# Patient Record
Sex: Female | Born: 1965 | Race: White | Hispanic: No | Marital: Married
Health system: Southern US, Community
[De-identification: ages and names within clinical notes are randomized; demographics above are authoritative.]

---

## 1998-12-21 ENCOUNTER — Emergency Department (HOSPITAL_COMMUNITY): Admission: EM | Admit: 1998-12-21 | Discharge: 1998-12-21 | Payer: Self-pay | Admitting: Emergency Medicine

## 2001-04-20 ENCOUNTER — Other Ambulatory Visit: Admission: RE | Admit: 2001-04-20 | Discharge: 2001-04-20 | Payer: Self-pay | Admitting: *Deleted

## 2001-04-23 ENCOUNTER — Encounter: Payer: Self-pay | Admitting: General Surgery

## 2001-04-23 ENCOUNTER — Ambulatory Visit (HOSPITAL_COMMUNITY): Admission: RE | Admit: 2001-04-23 | Discharge: 2001-04-23 | Payer: Self-pay | Admitting: General Surgery

## 2001-05-03 ENCOUNTER — Encounter: Payer: Self-pay | Admitting: General Surgery

## 2001-05-03 ENCOUNTER — Encounter (INDEPENDENT_AMBULATORY_CARE_PROVIDER_SITE_OTHER): Payer: Self-pay | Admitting: *Deleted

## 2001-05-03 ENCOUNTER — Ambulatory Visit (HOSPITAL_BASED_OUTPATIENT_CLINIC_OR_DEPARTMENT_OTHER): Admission: RE | Admit: 2001-05-03 | Discharge: 2001-05-03 | Payer: Self-pay | Admitting: General Surgery

## 2002-04-28 ENCOUNTER — Other Ambulatory Visit: Admission: RE | Admit: 2002-04-28 | Discharge: 2002-04-28 | Payer: Self-pay | Admitting: *Deleted

## 2004-10-03 ENCOUNTER — Other Ambulatory Visit: Admission: RE | Admit: 2004-10-03 | Discharge: 2004-10-03 | Payer: Self-pay | Admitting: Family Medicine

## 2005-09-30 ENCOUNTER — Ambulatory Visit (HOSPITAL_COMMUNITY): Admission: RE | Admit: 2005-09-30 | Discharge: 2005-09-30 | Payer: Self-pay | Admitting: Obstetrics and Gynecology

## 2005-10-05 ENCOUNTER — Inpatient Hospital Stay (HOSPITAL_COMMUNITY): Admission: AD | Admit: 2005-10-05 | Discharge: 2005-10-09 | Payer: Self-pay | Admitting: Obstetrics and Gynecology

## 2005-10-06 ENCOUNTER — Encounter (INDEPENDENT_AMBULATORY_CARE_PROVIDER_SITE_OTHER): Payer: Self-pay | Admitting: *Deleted

## 2005-11-10 ENCOUNTER — Other Ambulatory Visit: Admission: RE | Admit: 2005-11-10 | Discharge: 2005-11-10 | Payer: Self-pay | Admitting: Obstetrics and Gynecology

## 2006-06-05 IMAGING — US US OB COMP +14 WK
1 series · 13 of 24 positions shown · non-contrast
Comparison: none

CLINICAL DATA: Evaluate fetal weight and position; assigned gestational age is 40 weeks 4 days.

[Series 1: us ob comp +14 wk · 0.41mm/px · 13 of 24 slices shown]
[im 1/24]
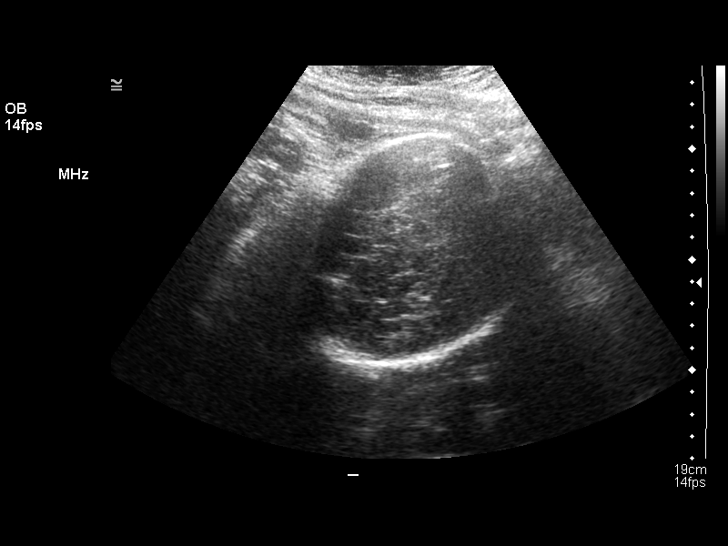
[im 3/24]
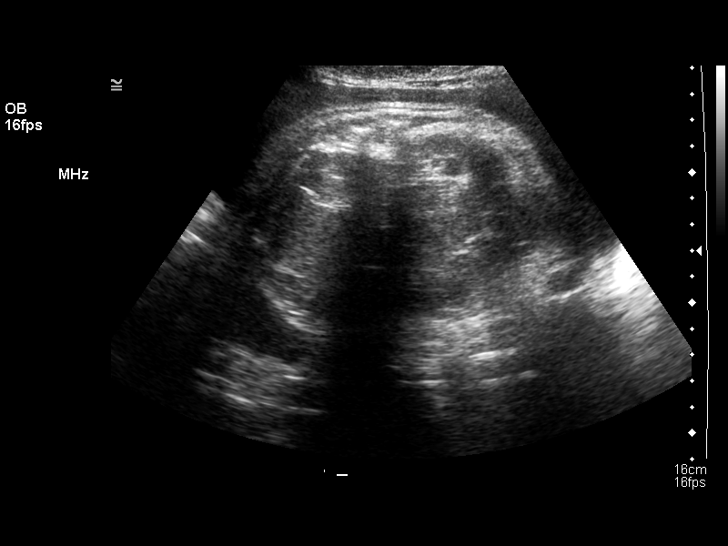
[im 5/24]
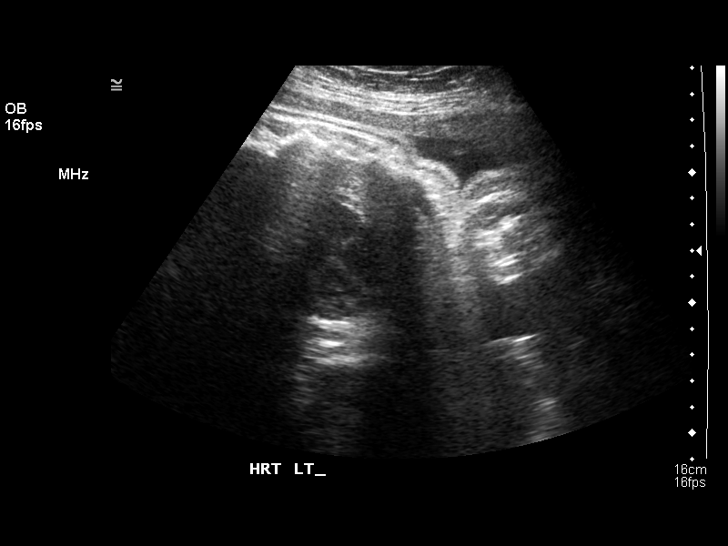
[im 7/24]
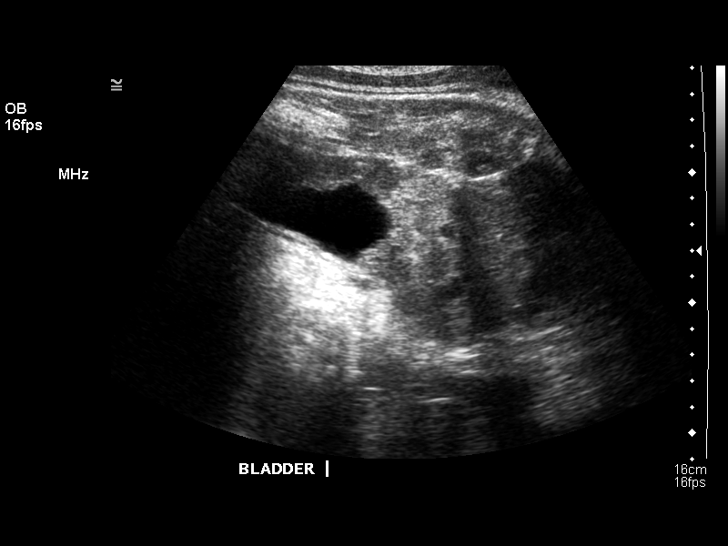
[im 9/24]
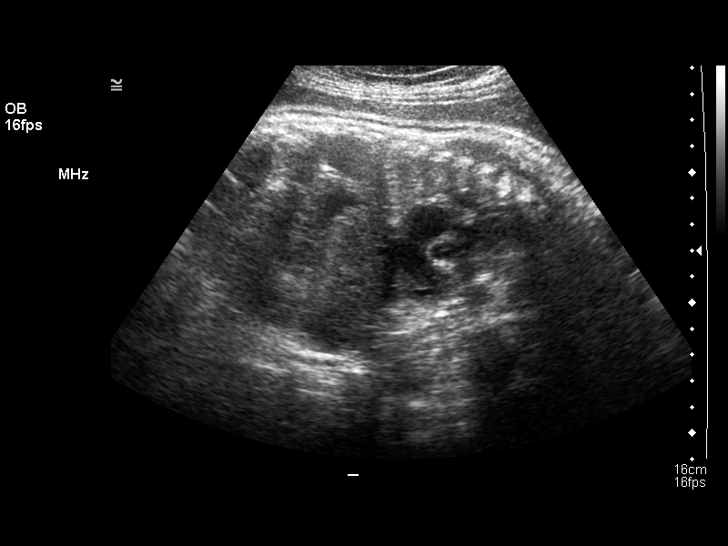
[im 11/24]
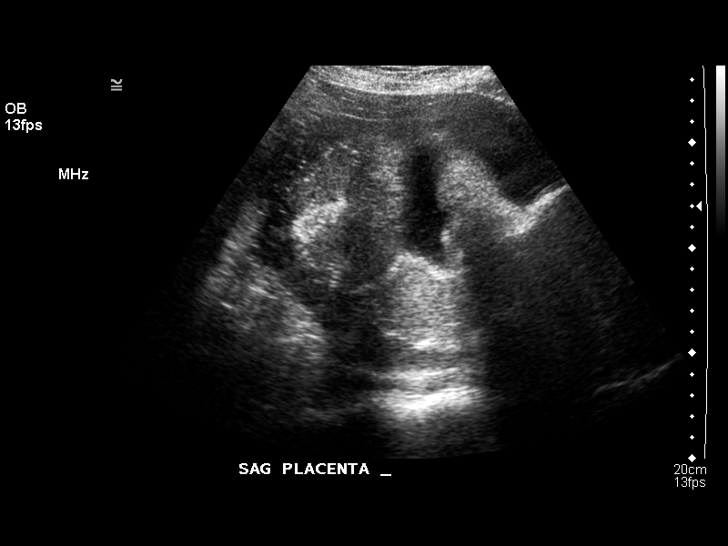
[im 13/24]
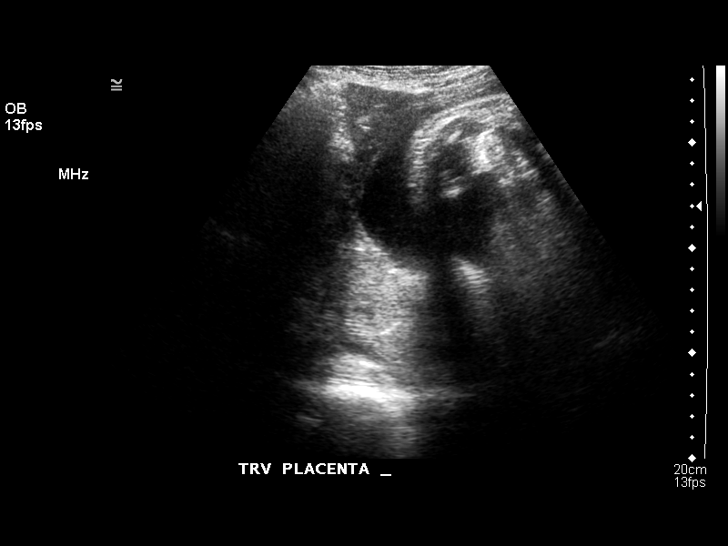
[im 14/24]
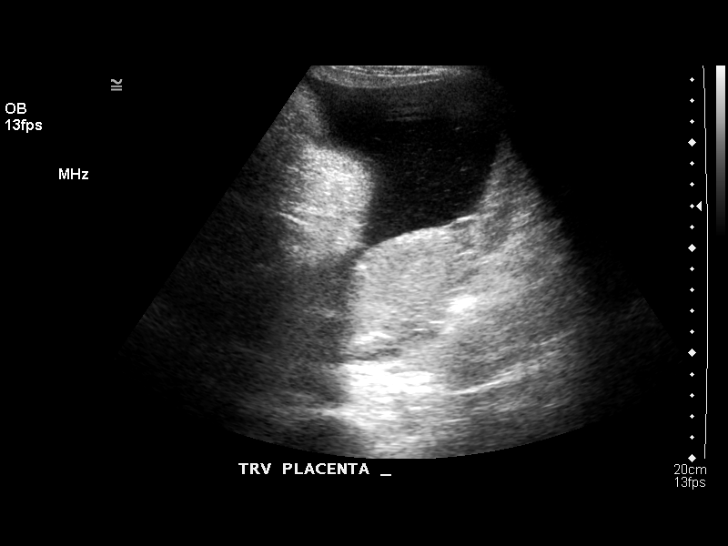
[im 16/24]
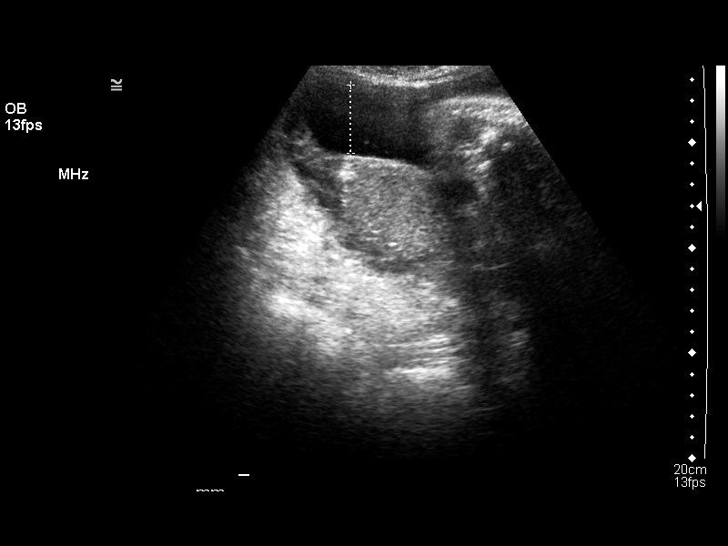
[im 18/24]
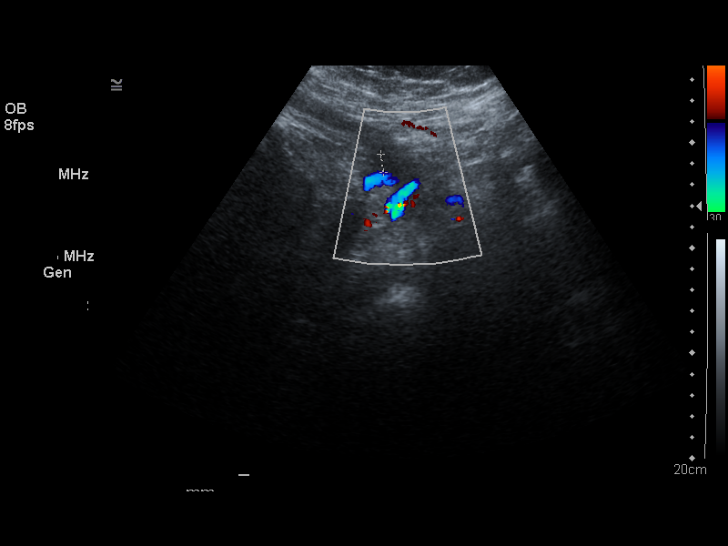
[im 20/24]
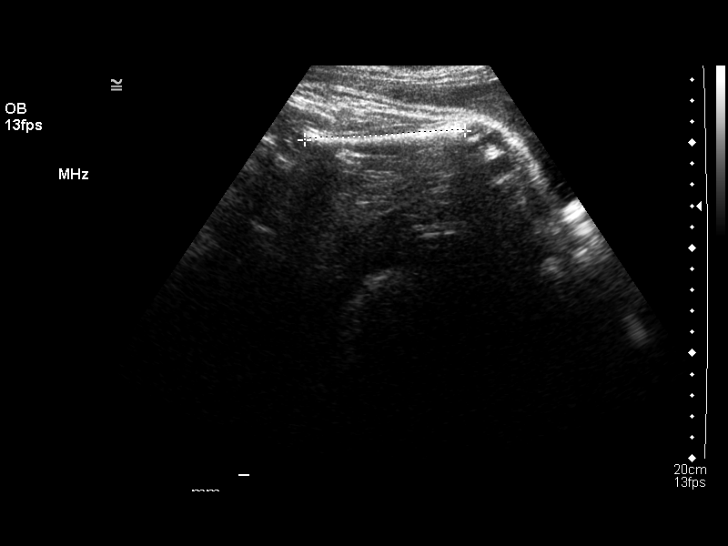
[im 22/24]
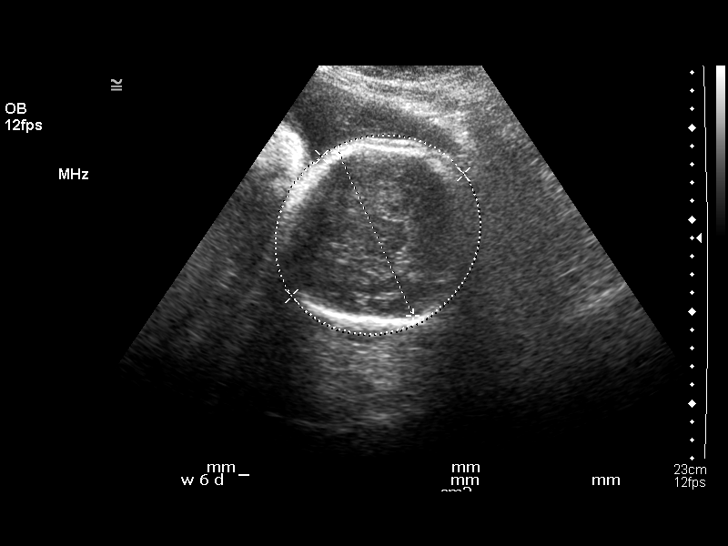
[im 24/24]
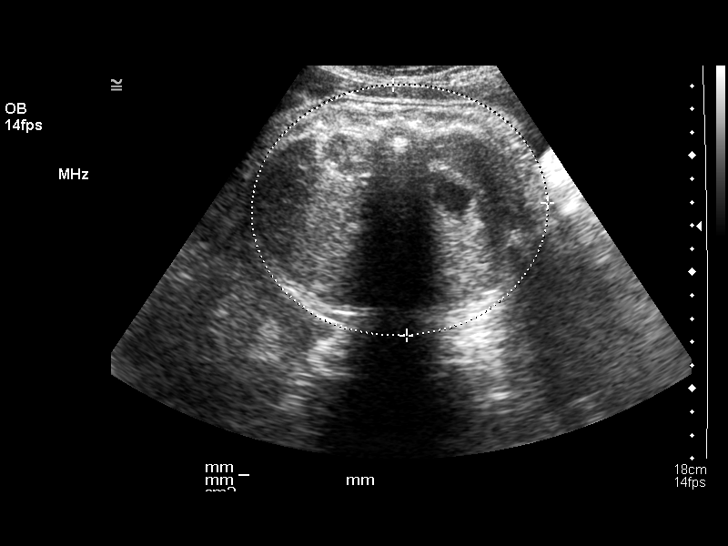

[13 of 24 positions shown; findings below may reference images not displayed]

OBSTETRICAL ULTRASOUND:
 Number of Fetuses:  1
 Heart Rate:  139
 Movement:  Yes
 Breathing:  No  
 Presentation:  Cephalic
 Placental Location:  Posterior
 Grade:  III
 Previa:  No
 Amniotic Fluid (Subjective):  Normal
 Amniotic Fluid (Objective):   14.1 cm AFI (5th -95th%ile = 7.0 ? 19.4 cm for 41 wks)

 FETAL BIOMETRY
 BPD:   9.8 cm  40 w 2 d
 HC:   34.5 cm   40 w 0 d
 AC:   36.6 cm   40 w 4 d
 FL:    7.6 cm   39 w 0 d

 MEAN GA:  40 w 0 d  

 EFW:  8281 g (H) 75th ? 90th%ile (9399 ? 6526 g) For 41 wks

 FETAL ANATOMY
 Lateral Ventricles:    Not visualized 
 Thalami/CSP:      Not visualized 
 Posterior Fossa:  Not visualized 
 Nuchal Region:    N/A
 Spine:      Not visualized 
 4 Chamber Heart on Left:      Not visualized 
 Stomach on Left:      Visualized 
 3 Vessel Cord:    Not visualized 
 Cord Insertion site:    Not visualized 
 Kidneys:  Visualized 
 Bladder:  Visualized 
 Extremities:      Not visualized 

 ADDITIONAL ANATOMY VISUALIZED:  Diaphragm.

 Evaluation limited by:  Fetal position and advanced gestational age.

 MATERNAL UTERINE AND ADNEXAL FINDINGS
 Cervix:   Not evaluated
IMPRESSION: There is a single living intrauterine gestation in cephalic presentation.  The estimated fetal weight is 8281 + / - 590 grams which is at the 75th percentile for a 41 week gestation.  The fetal indices are concordant.  The amniotic fluid volume is within normal limits.

## 2006-11-10 ENCOUNTER — Other Ambulatory Visit: Admission: RE | Admit: 2006-11-10 | Discharge: 2006-11-10 | Payer: Self-pay | Admitting: Obstetrics and Gynecology

## 2008-11-16 ENCOUNTER — Other Ambulatory Visit: Admission: RE | Admit: 2008-11-16 | Discharge: 2008-11-16 | Payer: Self-pay | Admitting: Obstetrics and Gynecology

## 2009-12-13 ENCOUNTER — Other Ambulatory Visit: Admission: RE | Admit: 2009-12-13 | Discharge: 2009-12-13 | Payer: Self-pay | Admitting: Obstetrics and Gynecology

## 2010-11-08 ENCOUNTER — Emergency Department (HOSPITAL_COMMUNITY): Payer: BC Managed Care – PPO

## 2010-11-08 ENCOUNTER — Inpatient Hospital Stay (HOSPITAL_COMMUNITY)
Admission: EM | Admit: 2010-11-08 | Discharge: 2010-11-10 | DRG: 494 | Disposition: A | Discharge status: Home / Self Care | Payer: BC Managed Care – PPO | Attending: General Surgery | Admitting: General Surgery

## 2010-11-08 ENCOUNTER — Other Ambulatory Visit: Payer: Self-pay | Admitting: Emergency Medicine

## 2010-11-08 DIAGNOSIS — E669 Obesity, unspecified: Secondary | ICD-10-CM | POA: Diagnosis present

## 2010-11-08 DIAGNOSIS — K810 Acute cholecystitis: Principal | ICD-10-CM | POA: Diagnosis present

## 2010-11-08 LAB — POCT PREGNANCY, URINE: Preg Test, Ur: NEGATIVE

## 2010-11-08 LAB — DIFFERENTIAL
Basophils Absolute: 0 10*3/uL (ref 0.0–0.1)
Basophils Relative: 0 % (ref 0–1)
Eosinophils Absolute: 0 10*3/uL (ref 0.0–0.7)
Eosinophils Relative: 0 % (ref 0–5)
Lymphocytes Relative: 7 % — ABNORMAL LOW (ref 12–46)
Lymphs Abs: 1.1 10*3/uL (ref 0.7–4.0)
Monocytes Absolute: 1 10*3/uL (ref 0.1–1.0)
Monocytes Relative: 6 % (ref 3–12)
Neutro Abs: 15.2 10*3/uL — ABNORMAL HIGH (ref 1.7–7.7)
Neutrophils Relative %: 88 % — ABNORMAL HIGH (ref 43–77)

## 2010-11-08 LAB — COMPREHENSIVE METABOLIC PANEL
ALT: 32 U/L (ref 0–35)
AST: 26 U/L (ref 0–37)
Alkaline Phosphatase: 41 U/L (ref 39–117)
CO2: 26 mEq/L (ref 19–32)
Chloride: 102 mEq/L (ref 96–112)
GFR calc Af Amer: >60 mL/min (ref >60)
GFR calc non Af Amer: >60 mL/min (ref >60)
Potassium: 3.8 mEq/L (ref 3.5–5.1)
Sodium: 136 mEq/L (ref 135–145)
Total Bilirubin: 1.2 mg/dL (ref 0.3–1.2)

## 2010-11-08 LAB — URINALYSIS, ROUTINE W REFLEX MICROSCOPIC
Bilirubin Urine: NEGATIVE
Hgb urine dipstick: NEGATIVE
Specific Gravity, Urine: 1.017 (ref 1.005–1.030)
Urine Glucose, Fasting: NEGATIVE mg/dL

## 2010-11-08 LAB — CBC
HCT: 40.6 % (ref 36.0–46.0)
Hemoglobin: 14.3 g/dL (ref 12.0–15.0)
MCV: 86.9 fL (ref 78.0–100.0)
WBC: 17.3 10*3/uL — ABNORMAL HIGH (ref 4.0–10.5)

## 2010-11-09 LAB — CBC
HCT: 35.8 % — ABNORMAL LOW (ref 36.0–46.0)
Hemoglobin: 12 g/dL (ref 12.0–15.0)
MCH: 29.9 pg (ref 26.0–34.0)
MCHC: 33.5 g/dL (ref 30.0–36.0)
MCV: 89.1 fL (ref 78.0–100.0)
Platelets: 228 10*3/uL (ref 150–400)
RBC: 4.02 MIL/uL (ref 3.87–5.11)
RDW: 13.2 % (ref 11.5–15.5)
WBC: 10.8 10*3/uL — ABNORMAL HIGH (ref 4.0–10.5)

## 2010-11-12 NOTE — H&P (Signed)
Christine Norton, Christine Norton              ACCOUNT NO.:  [id number]  MEDICAL RECORD NO.:  [id number]           PATIENT TYPE:  E  LOCATION:  WLED                         FACILITY:  [facility]  PHYSICIAN:  Adolph Pollack, M.D.DATE OF BIRTH:  November 22, 1965  DATE OF ADMISSION:  11/08/2010 DATE OF DISCHARGE:                             HISTORY & PHYSICAL   CHIEF COMPLAINT:  Abdominal pain.  HISTORY OF PRESENT ILLNESS:  Christine Norton is a 45 year old Caucasian female who developed upper abdominal and right upper quadrant abdominal pain last night about 7:30 shortly after eating dinner.  She did not have any particularly fatty meal, nevertheless developed some abdominal pain.  This was not relieved by over-the-counter antacids, subsequently resulting in nausea, vomiting.  She denies any fever, chills, chest pain, shortness of breath, palpitations.  She denies any diarrhea or constipation.  She denies any hematochezia.  She denies dysuria or hematuria.  She denies any other ill contacts, travel history, or trauma to the abdomen.  She was brought to the Emergency Department by her husband and evaluation here in the Emergency Room revealed elevated white blood cell count and an ultrasound finding evidence of gallstones and gallbladder wall thickening suggestive of cholecystitis.  We are asked to see the patient for surgical consultation.  PAST MEDICAL HISTORY:  Significant only procedure with seasonal allergies.  SURGICAL HISTORY:  C-section in 2007.  FAMILY HISTORY:  Noncontributory to present case.  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  She denies any tobacco or illicit drug use.  She does have an occasional glass of wine, perhaps once or twice a week.  DRUG ALLERGIES:  No known drug or latex allergies.  MEDICATIONS: 1. Multivitamins. 2. Flonase. 3. Flaxseed oil. 4. Claritin. 5. Calcium plus D.  REVIEW OF SYSTEMS:  Please see history  present illness for pertinent findings, otherwise complete 10-system review found negative.  PHYSICAL EXAM:  GENERAL:  Reveals a 45 year old obese Caucasian female, does not appear in acute distress or toxic. VITAL SIGNS:  Temperature is 99.2, heart rate 76, respiratory rate of 16, blood pressure 123/74. ENT:  Unremarkable. NECK:  Supple without lymphadenopathy.  Trachea is midline.  No thyromegaly or masses. LUNGS:  Clear to auscultation.  No wheezes, rhonchi, or rales.  Normal respiratory effort without use of accessory muscles. HEART:  Regular rate and rhythm.  No murmurs, gallops, or rubs. Carotids 2+ and brisk without bruits.  Peripheral pulses 2+ and symmetrical. ABDOMEN:  Soft and nondistended.  No organomegaly or masses are palpable.  She is tender in the right upper quadrant without guarding or rebound.  Bowel sounds are active.  Lower surgical scar is noted compatible with her C-section history. RECTAL:  Exam is deferred. GENITOURINARY:  Exam is deferred. EXTREMITIES:  Good active range of motion of all extremities without crepitus or pain.  Normal muscle strength and tone without atrophy. SKIN:  Otherwise warm and dry.  Good turgor.  No jaundice.  No rashes, lesions, nodules. NEUROLOGIC:  The patient is alert and oriented x3 and has normal affect.  DIAGNOSTICS:  CBC showed white blood cell  count of 17.3 with left shift, hemoglobin of 14.3, hematocrit of 40.6, platelet count of 253,000. Metabolic panel shows sodium of 136, potassium 3.8, chloride of 102, CO2 of 26, BUN of 8, creatinine of 0.6, glucose of 113.  Liver enzymes show bilirubin of 1.2, alkaline phosphatase of 41, AST of 26, ALT of 32, lipase normal at 19.  Urinalysis negative.  IMAGING:  Ultrasound of the abdomen shows evidence of gallstones, borderline wall thickening of 4 mm, and suggestion of pericholecystic fluid.  Common bile duct seemed to be within normal limits without any changes in the liver or  pancreas, consistent with early or mild cholecystitis.  IMPRESSION: 1. Acute cholecystitis. 2. Plan to admit the patient, start her on IV antibiotics, and plan     for cholecystectomy.  The plan including the procedure itself and     as well as risks, complications, and postoperative expectations     were explained to the patient and her husband in great detail.     Questions were answered regarding recovery time and limitations.     Will move forward to the Operating Room this evening for the said     procedure.     Brayton El, PA-C   ______________________________ Adolph Pollack, M.D.    KB/MEDQ  D:  11/08/2010  T:  11/08/2010  Job:  [id number]  Electronically Signed by Brayton El  on 11/11/2010 02:21:57 PM Electronically Signed by Avel Peace M.D. on 11/12/2010 09:57:41 AM

## 2010-11-12 NOTE — Op Note (Signed)
NAMERHYLIN, VENTERS              ACCOUNT NO.:  [id number]  MEDICAL RECORD NO.:  [id number]           PATIENT TYPE:  I  LOCATION:  1532                         FACILITY:  [facility]  PHYSICIAN:  Adolph Pollack, M.D.DATE OF BIRTH:  1966-03-20  DATE OF PROCEDURE:  11/08/2010 DATE OF DISCHARGE:  11/10/2010                              OPERATIVE REPORT   PREOPERATIVE DIAGNOSIS:  Acute cholecystitis.  POSTOPERATIVE DIAGNOSIS:  Acute cholecystitis.  PROCEDURE:  Laparoscopic cholecystectomy, intraoperative cholangiogram.  SURGEON:  Adolph Pollack, M.D.  ASSISTANT:  Lennie Muckle, MD.  ANESTHESIA:  General.  INDICATIONS:  This is a 45 year old female who presented with fairly classic signs and symptoms, laboratory, and x-ray findings of acute cholecystitis.  She now brought to the Operating Room for urgent laparoscopic, possible open cholecystectomy.  Procedure and risks were discussed with her preoperatively.  TECHNIQUE:  She had been given intravenous antibiotics.  She is brought to the Operating Room, placed supine on the operating table and general anesthetic was administered.  The abdominal wall was sterilely prepped and draped.  In the subumbilical region, Marcaine solution was infiltrated into the subcutaneous tissues.  A small subumbilical incision was made through the skin, subcutaneous tissue, and midline fascia and the peritoneal cavity was then entered under direct vision. A pursestring suture of 0 Vicryl was placed around the fascial edges.  A Hassan trocar was introduced into the peritoneal cavity and pneumoperitoneum created by insufflation of CO2 gas.  Next, laparoscope introduced.  There is no underlying organ injury or evidence of bleeding.  She is placed in the reverse Trendelenburg position, the right side tilted slightly up.  An acutely inflamed distended gallbladder could be seen.  A 5-mm trocar was placed in the epigastric region and two 5 mm trocars  placed in the right upper quadrant.  Needle decompression was performed on the tense gallbladder and purulent browned fluid was evacuated.  The fundus of the gallbladder was then able to be grasped and retracted toward the right shoulder.  The infundibulum was grasped and inflammatory adhesions were then taken down bluntly exposing the infundibulum.  I then mobilized the infundibulum with blunt dissection on the gallbladder.  I then identified the cystic duct and created a window around it.  A clip was placed at the cystic duct gallbladder junction.  A small incision was made in the cystic duct and obstructing stone was then milked out the cystic duct and removed.  A cholangiocatheter was then passed through the anterior abdominal wall placed into the cystic duct and cholangiogram was performed.  Under real time fluoroscopy, dilute contrast was injected into the cystic duct.  Common hepatic, right left hepatic, and common bile ducts all filled promptly with contrast.  Contrast drained promptly into the duodenum without obvious evidence of obstruction.  Final report is pending the radiologist interpretation.  A cholangiocatheter was removed, the cystic duct was clipped three times on the biliary side and divided.  An anterior and posterior branch of the cystic artery were identified.  Windows were recreated around them. There were clipped and divided.  Using electrocautery and some blunt dissection, the gallbladder  was removed from the liver and placed in Endopouch bag.  I then removed the gallbladder to the subumbilical incision but that had the enlarged the fascial defect to the gallbladder and some large stones out.  I reintroduced the [facility] trocar.  There is some diffuse oozing from the gallbladder fossa and I irrigated this out and controlled bleeding with electrocautery.  Further inspection demonstrated no evidence of bleeding.  A piece of Surgicel was placed in the gallbladder  fossa.  I then introduced to drain into the abdominal cavity through the [facility] trocar and pulled out the lateral 5-mm port.  The drain was placed in the gallbladder fossa and anchored to the skin with 3-0 nylon suture. The irrigation fluid was evacuated much as possible.  I then closed the subumbilical fascial defect with interrupted 0 Vicryl sutures.  The remaining trocars removed and pneumoperitoneum was released.  The drain was hooked to closed suction.  Remaining skin incisions were closed with 4-0 Monocryl subcuticular stitches. Subumbilical skin incision was irrigated before closure.  Steri-Strips and sterile dressings were applied.  She tolerated the procedure well without any apparent complications and was taken to the Recovery Room in satisfactory condition.     Adolph Pollack, M.D.     Kari Baars  D:  11/10/2010  T:  11/10/2010  Job:  [id number]  Electronically Signed by Avel Peace M.D. on 11/12/2010 09:58:23 AM

## 2010-12-20 NOTE — Discharge Summary (Signed)
  Christine Norton, Christine Norton              ACCOUNT NO.:  [id number]  MEDICAL RECORD NO.:  [id number]           PATIENT TYPE:  I  LOCATION:  1532                         FACILITY:  [facility]  PHYSICIAN:  Velora Heckler, MD      DATE OF BIRTH:  1965-12-11  DATE OF ADMISSION:  11/08/2010 DATE OF DISCHARGE:  11/10/2010                              DISCHARGE SUMMARY   HISTORY OF PRESENT ILLNESS:  Christine Norton is a 45 year old female who presented with upper abdominal pain.  She presents to the emergency department, found evidence of gallstones and gallbladder wall thickening suggestive of cholecystitis.  She was evaluated by Dr. Abbey Chatters who decided to admit the patient for operative management.  SUMMARY OF HOSPITAL COURSE:  The patient was admitted on November 08, 2010, was taken to the operating room on same day and underwent laparoscopic cholecystectomy with intraoperative cholangiogram.  She tolerated the procedure well without any significant postoperative complications.  She had a JP drain that only produced some serous fluid and this was removed and on postop day #2, the patient was determined to be stable for discharge.  DISCHARGE DIAGNOSIS:  Acute cholecystitis, status post laparoscopic cholecystectomy.  PLAN:  The patient will follow up in clinic in approximately 2-3 weeks with preprinted discharge instructions to follow and a prescription for Percocet 1-2 tablets q.6h. p.r.n. pain.     Christine El, PA-C   ______________________________ Velora Heckler, MD    KB/MEDQ  D:  12/10/2010  T:  12/10/2010  Job:  [id number]  Electronically Signed by Christine Norton  on 12/11/2010 02:09:21 PM Electronically Signed by Darnell Level MD on 12/20/2010 09:17:54 AM

## 2011-01-22 ENCOUNTER — Other Ambulatory Visit (HOSPITAL_COMMUNITY)
Admission: RE | Admit: 2011-01-22 | Discharge: 2011-01-22 | Disposition: A | Discharge status: Home / Self Care | Payer: BC Managed Care – PPO | Source: Ambulatory Visit | Attending: Obstetrics and Gynecology | Admitting: Obstetrics and Gynecology

## 2011-01-22 ENCOUNTER — Other Ambulatory Visit: Payer: Self-pay | Admitting: Obstetrics and Gynecology

## 2011-01-22 DIAGNOSIS — Z01419 Encounter for gynecological examination (general) (routine) without abnormal findings: Secondary | ICD-10-CM | POA: Insufficient documentation

## 2011-02-14 NOTE — Op Note (Signed)
[place]  Patient:    Christine Norton, Christine Norton                     MRN: [id number] Proc. Date: 05/03/01 Adm. Date:  [id number] Attending:  Janalyn Rouse CC:         Heather Roberts, M.D.  Rocco Serene, M.D.   Operative Report  PREOPERATIVE DIAGNOSIS:  Melanoma of the right shoulder.  POSTOPERATIVE DIAGNOSIS:  Melanoma of the right shoulder.  PROCEDURES: 1. Blue dye injection. 2. Wide excision of melanoma of the right shoulder with layered closure. 3. Right axillary sentinel lymph node biopsy.  SURGEON:  Rose Phi. Maple Hudson, M.D.  ANESTHESIA:  General.  INDICATION FOR PROCEDURE:  This 45 year old married female had presented with a lesion of the right shoulder, which had been biopsied and shown to be a superficial spreading melanoma, 0.74 mm thick, and a level IV invasion. Because of the level IV and the status at 0.74 mm, it was thought to be appropriate that we do sentinel node biopsy.  Preoperative lymphoscintigram showed that the only drainage pattern was to the right axilla.  DESCRIPTION OF PROCEDURE:  After suitable general anesthesia was induced, the patient was placed in the supine position and 1 cc of lymphazurin blue was injected around the melanoma.  About an hour and a half prior to coming to the operating room, the Technetium sulfur colloid had been injected.  We carefully scanned the axilla, and there was one hot spot there.  We then prepped the breast, shoulder, upper arm, and axilla, and draped it out.  A short transverse axillary incision was made with careful dissection through the subcutaneous tissue to the clavipectoral fascia.  One lymph node I could identify that was neither hot nor blue, but I removed it.  Then posterior and deeper to this was a bluish and hot node with counts in the 275-300 range, which we also excised.  There were no other blue or hot spots or palpable nodes.  That was submitted as the sentinel  node, and then the original node we took out was submitted as "other nodes."  The incision was closed with 3-0 Vicryl and subcuticular 4-0 Monocryl and Steri-Strips.  We then turned our attention to the right shoulder, and a 1 cm margin was measured circumferentially and then an ellipse centered superior to inferior was excised down to the fascia.  Hemostasis obtained with the cautery.  This gave [place] a defect of 5 x 10 cm.  We mobilized the subcutaneous tissue and then closed it in layers with 2-0 Vicryl and interrupted 4-0 nylon sutures. Dressing applied.  The patient transferred to the recovery room in satisfactory condition, having tolerated the procedure well. DD:  05/03/01 TD:  05/04/01 Job: [id number]

## 2011-02-14 NOTE — Discharge Summary (Signed)
Christine Norton, Christine Norton              ACCOUNT NO.:  [id number]   MEDICAL RECORD NO.:  [id number]          PATIENT TYPE:  INP   LOCATION:  9148                          FACILITY:  [facility]   PHYSICIAN:  James A. Ashley Royalty, M.D.DATE OF BIRTH:  Nov 15, 1965   DATE OF ADMISSION:  10/05/2005  DATE OF DISCHARGE:  10/09/2005                                 DISCHARGE SUMMARY   DISCHARGE DIAGNOSES:  1.  Intrauterine pregnancy at [redacted] weeks gestation, delivered.  2.  Arrest disorder of dilatation in labor.  3.  History of melanoma.  4.  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   OPERATIONS AND SPECIAL PROCEDURES:  Primary low-transverse cesarean section.   CONSULTATIONS:  None.   DISCHARGE MEDICATIONS:  1.  Motrin 600 mg.  2.  Chromagen.  3.  Labetalol 200 mg b.i.d.   HISTORY AND PHYSICAL:  This is a 45 year old gravida 2, para 0, AB 1 at [redacted]  weeks gestation.  Prenatal care was complicated by the aforementioned  diagnoses in addition to advanced maternal age, for which the patient did  have an amniocentesis, which revealed normal fetus chromosomally.  The  patient was admitted for induction secondary to post dates gestation.  For  the remainder of the history and physical, please see chart.   HOSPITAL COURSE:  The patient was admitted to [facility]  [place].  Admission laboratory studies were drawn.  She went on to labor  on October 06, 2005.  She developed an arrest disorder of dilatation, which  required a cesarean section.  The procedure yielded a 7 pound 13 ounce  female.  Apgars 8 at one minute and 9 at five minutes, and sent to the  newborn nursery.  There was a nuchal cord x2.  The patient's postpartum  course was essentially benign.  The blood pressure was noted to be upper  limits of normal and as a safe guard, the patient was discharged home on  labetalol 200 mg p.o. b.i.d. with instructions to return to the office in  approximately four days for follow up.  A PIH panel  was obtained and was  quite reassuring.   DISPOSITION:  The patient is to return to [facility] and Obstetrics  in approximately four days.      James A. Ashley Royalty, M.D.  Electronically Signed     JAM/MEDQ  D:  11/05/2005  T:  11/05/2005  Job:  [id number]

## 2011-02-14 NOTE — Op Note (Signed)
[name], Christine Norton              ACCOUNT NO.:  [id number]   MEDICAL RECORD NO.:  [id number]          PATIENT TYPE:  INP   LOCATION:  9148                          FACILITY:  [facility]   PHYSICIAN:  James A. Ashley Royalty, M.D.DATE OF BIRTH:  1966-09-17   DATE OF PROCEDURE:  10/06/2005  DATE OF DISCHARGE:                                 OPERATIVE REPORT   PREOPERATIVE DIAGNOSIS:  1.  Intrauterine pregnancy at [redacted] weeks gestation.  2.  Arrest disorder of dilatation in labor.   POSTOPERATIVE DIAGNOSIS:  1.  Intrauterine pregnancy at [redacted] weeks gestation.  2.  Arrest disorder of dilatation in labor.   PROCEDURE:  Primary low transverse cesarean section.   SURGEON:  Rudy Jew. Ashley Royalty, M.D.   ANESTHESIA:  Epidural   FINDINGS:  7 pounds 13 ounce female Apgars 8 at one minute and 9 at 5  minutes sent to newborn nursery.   ESTIMATED BLOOD LOSS:  700 mL.   COMPLICATIONS:  None.   PACKS DRAINS:  Foley.   Needle, sponge, and instrument counts correct x2.   PROCEDURE:  The patient was taken to the operating room, placed in the  dorsosupine position. Functioning epidural was already in place. She was  prepped and draped in usual manner for abdominal surgery. Foley catheter was  already in place as well.   After surgical levels of anesthesia were verified a Pfannenstiel incision  was made down to the level of the fascia which was nicked with knife incised  transversely with Mayo scissors. The underlying rectus muscles were  separated from the fascia using sharp and blunt dissection. Rectus muscles  were separated in midline exposing peritoneum which was elevated with  hemostats and entered atraumatically with Metzenbaum scissors. Incision was  extended longitudinally. The uterus was identified. A bladder flap created  by incising the anterior uterine serosa and sharply and bluntly dissecting  the bladder inferiorly. It was held in place with a bladder blade. The  uterus was then entered  through a low transverse incision using sharp and  blunt dissection. The fluid was noted to be clear. The infant was then  delivered from a vertex presentation in atraumatic manner. The infant was  suctioned. The cord was noted to be around the baby's neck x2. The cord was  unwrapped and doubly clamped and cut. The infant was given immediately to  the waiting pediatrics team. Cord blood was obtained. Umbilical cord was  noted be rather modest diameter and decision was made send it to pathology  for histologic studies. The placenta and membranes removed in their  entirety. The uterus was exteriorized. The uterus was then closed in two  running layers of #1 Vicryl. The first running locking layer. The second was  a running, intermittently locking, and imbricating layer. Two additional  figure-of-eight sutures were required to obtain hemostasis. Hemostasis was  noted. At this point the uterus, tubes and ovaries were inspected, found to  be normal. They were returned to abdominal cavity. Copious irrigation was  accomplished. Hemostasis was noted. Next, the perineum was closed with 3-0  Vicryl in running fashion. The fascia  was closed 0 Vicryl in running  fashion. The skin was closed with staples.   The patient tolerated the procedure extremely well and was returned to the  recovery room in good condition. At the conclusion of procedure, the urine  was clear and copious.      James A. Ashley Royalty, M.D.  Electronically Signed     JAM/MEDQ  D:  10/06/2005  T:  10/07/2005  Job:  [id number]

## 2011-02-14 NOTE — H&P (Signed)
Christine Norton, KHURANA              ACCOUNT NO.:  [id number]   MEDICAL RECORD NO.:  [id number]          PATIENT TYPE:  INP   LOCATION:                                FACILITY:  [facility]   PHYSICIAN:  James A. Ashley Royalty, M.D.     DATE OF BIRTH:   DATE OF ADMISSION:  10/05/2005  DATE OF DISCHARGE:                                HISTORY & PHYSICAL   Please have this dictation available on labor and delivery at [facility] at 9 p.m. on October 05, 2005.   This is a 45 year old gravida 2, para 0, AB 1, EDC September 26, 2005, 41  weeks' gestation.  Risk factors include advanced maternal age, for which the  patient had amniocentesis, also a history of melanoma and seropositivity for  HSV-type 2.  Recent ultrasound was performed at [facility] September 30, 2005, and revealed estimated fetal weight of 3934 g.  Infant was noted to be  vertex presentation.  The patient is admitted for induction secondary to  post-dates gestation.   MEDICATIONS:  Vitamins.   PAST MEDICAL HISTORY:  Medical:  As above.   Surgical:  D&C 2004, oral surgery.   ALLERGIES:  None.   FAMILY HISTORY:  Noncontributory.   SOCIAL HISTORY:  The patient denies the use of tobacco or significant  alcohol.   REVIEW OF SYSTEMS:  Noncontributory.   PHYSICAL EXAMINATION:  GENERAL:  A well-developed, well-nourished, pleasant  white female in no acute distress.  VITAL SIGNS:  Afebrile, vital signs stable.  HEENT:  Normocephalic.  CHEST:  Lungs are clear.  CARDIAC:  Regular rate and rhythm.  ABDOMEN:  Gravid with term fundal height, fetal heart tones are auscultated.  MUSCULOSKELETAL:  No CVA tenderness.  PELVIC (please see October 03, 2005, office note):  Cervix closed, 75%  effaced, -2 to -3 station, vertex presentation.   IMPRESSION:  1.  Intrauterine pregnancy at 41+ weeks' gestation.  2.  Advanced maternal age.  3.  History of melanoma.  4.  Seropositivity for herpes simplex virus type 2.   PLAN:  1.   Admit.  2.  Cervidil this evening, followed by Pitocin induction in a.m.      James A. Ashley Royalty, M.D.  Electronically Signed     JAM/MEDQ  D:  10/03/2005  T:  10/03/2005  Job:  [id number]

## 2011-07-14 IMAGING — RF DG CHOLANGIOGRAM OPERATIVE
1 series · 4 of 4 positions shown · non-contrast
Comparison: 11/08/2010 ultrasound

CLINICAL DATA: Cholecystectomy for cholecystitis.  To evaluate
common bile duct.

INTRAOPERATIVE CHOLANGIOGRAM
TECHNIQUE: Cholangiographic images from the C-arm fluoroscopic
device were submitted for interpretation post-operatively.  Please
see the procedural report for the amount of contrast and the
fluoroscopy time utilized.

[Series 1: run · 4 of 105 frames shown]
[frame 16/105]
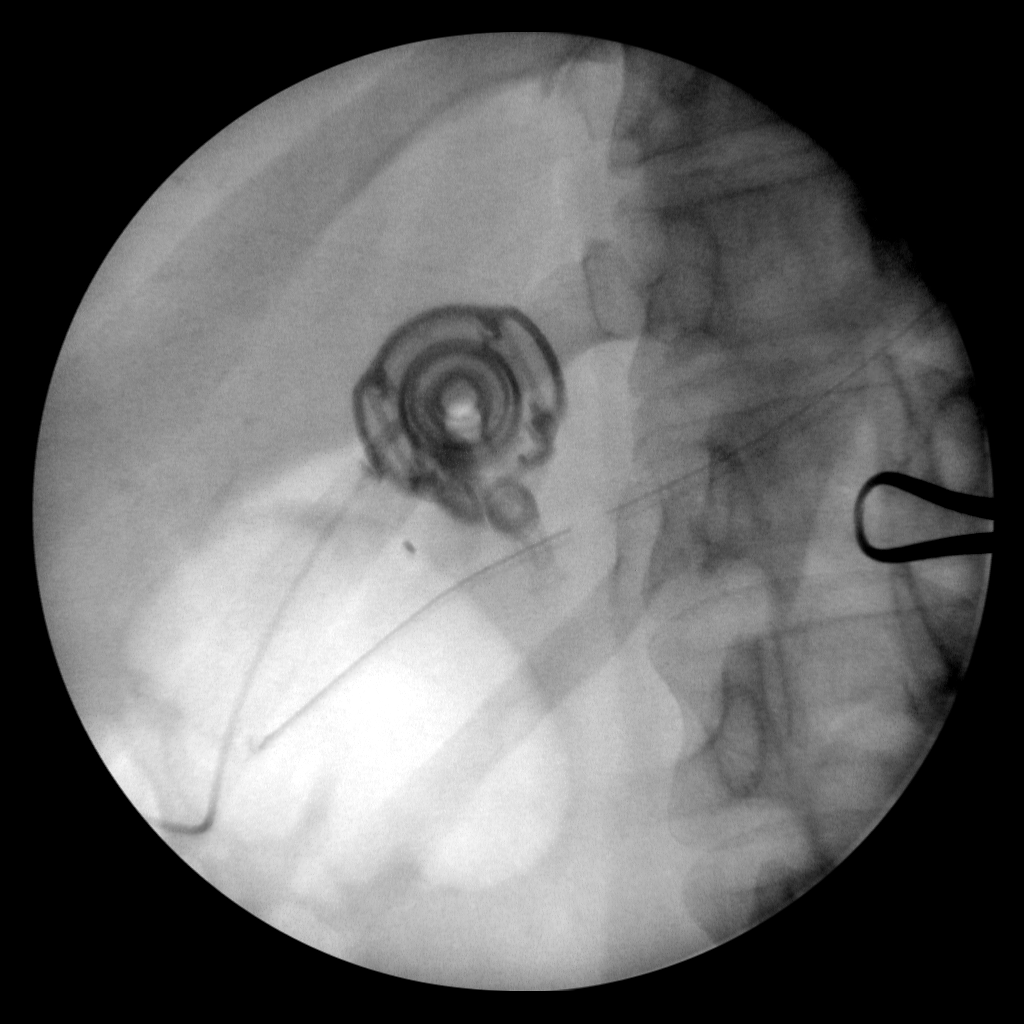
[frame 53/105]
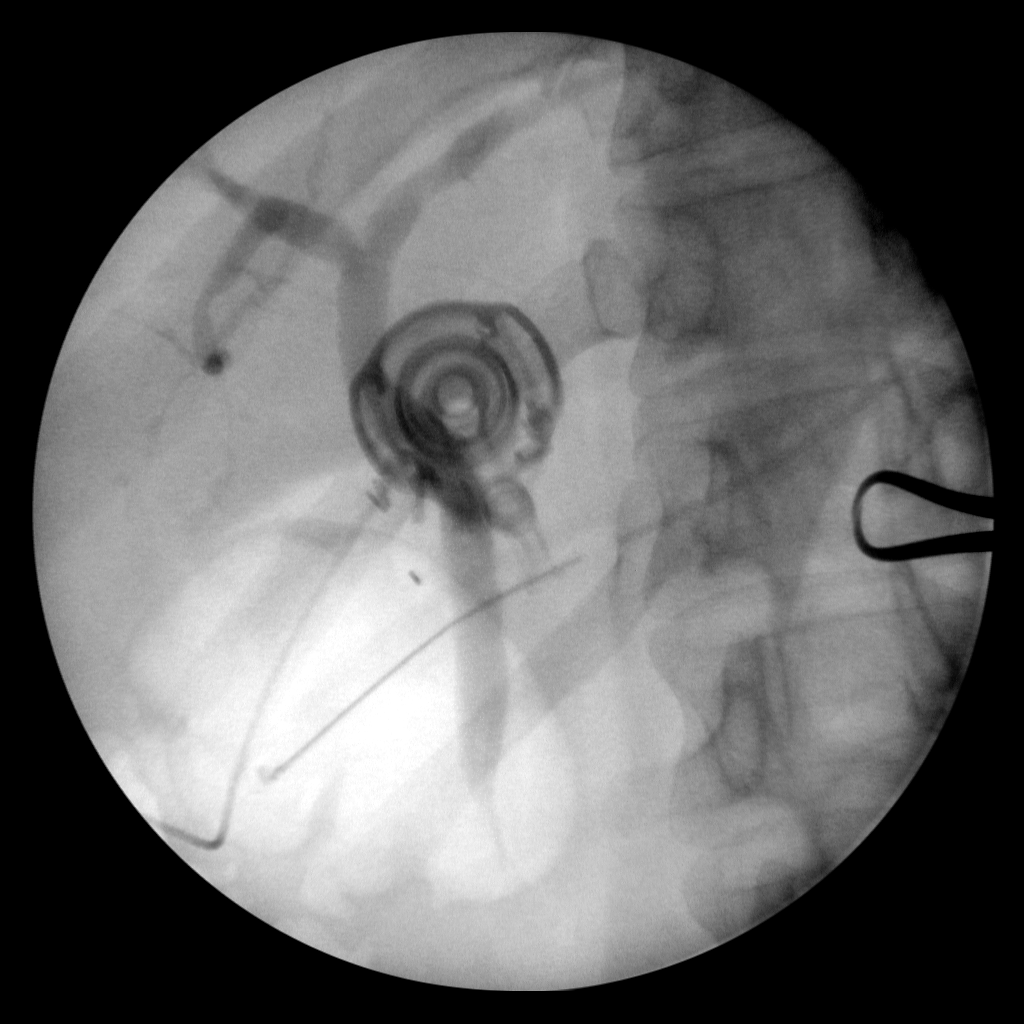
[frame 90/105]
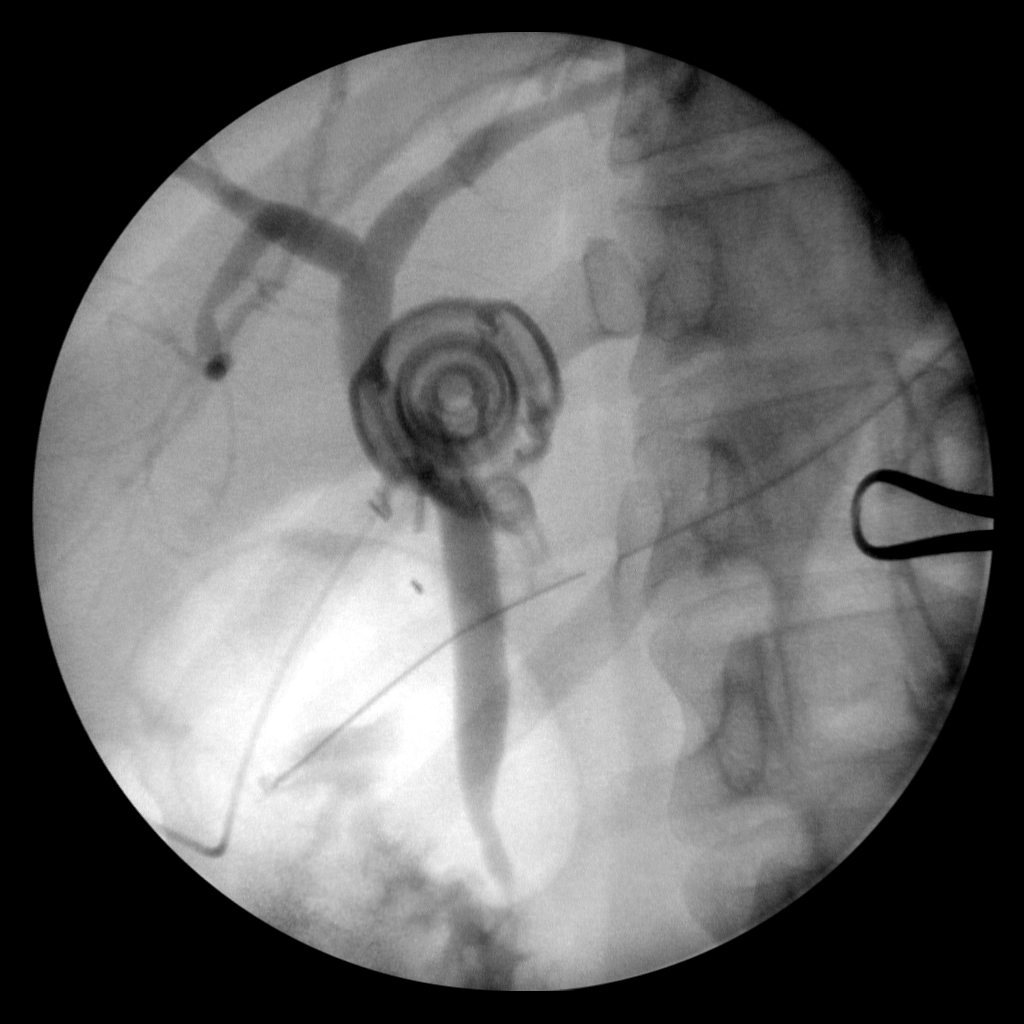
[frame 98/105]
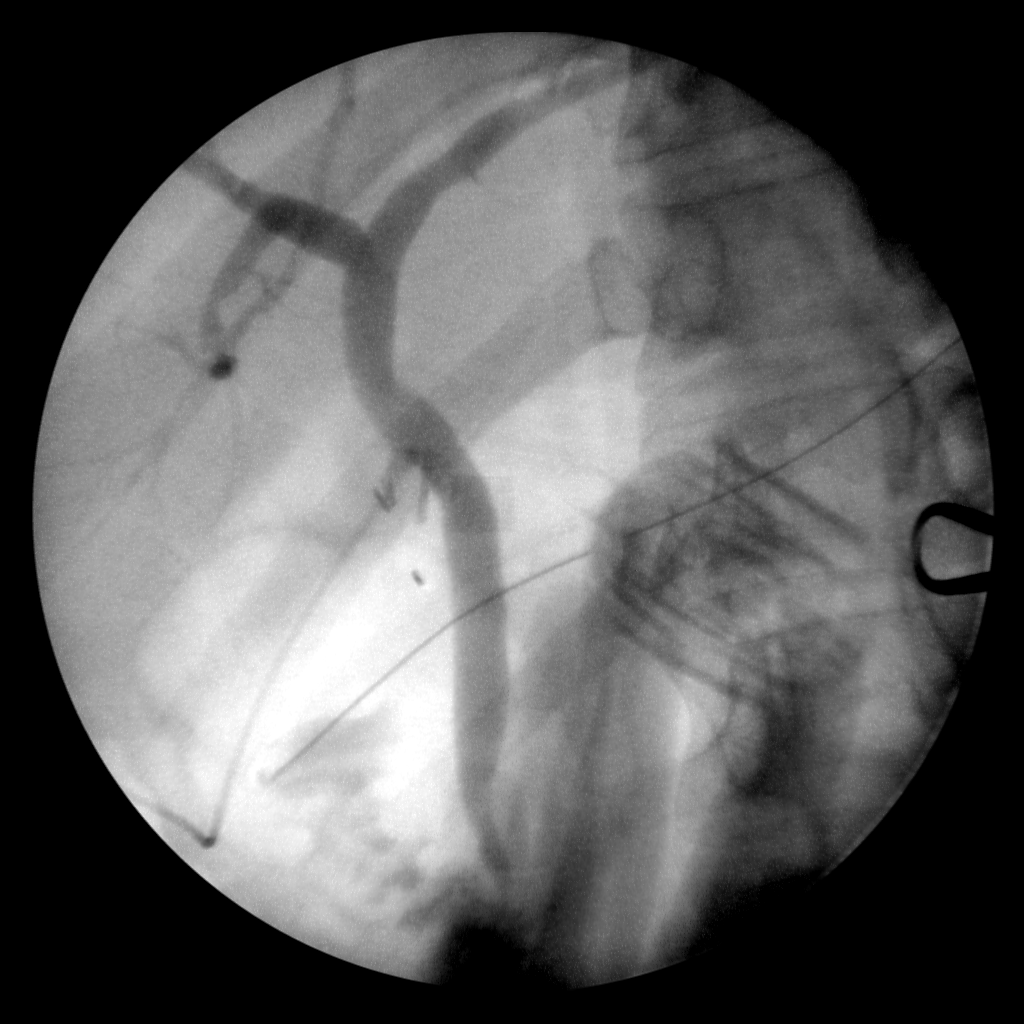

[4 of 4 positions shown; findings below may reference images not displayed]

FINDINGS: The common bile duct and visualized intrahepatic biliary
system are unremarkable.
No filling defects or areas of fixed narrowing are identified.
Contrast  flows freely into the duodenum.
IMPRESSION: Unremarkable intraoperative cholangiogram.

## 2012-01-22 ENCOUNTER — Other Ambulatory Visit: Payer: Self-pay | Admitting: Obstetrics and Gynecology

## 2012-01-22 ENCOUNTER — Other Ambulatory Visit (HOSPITAL_COMMUNITY)
Admission: RE | Admit: 2012-01-22 | Discharge: 2012-01-22 | Disposition: A | Discharge status: Home / Self Care | Payer: BC Managed Care – PPO | Source: Ambulatory Visit | Attending: Obstetrics and Gynecology | Admitting: Obstetrics and Gynecology

## 2012-01-22 DIAGNOSIS — Z01419 Encounter for gynecological examination (general) (routine) without abnormal findings: Secondary | ICD-10-CM | POA: Insufficient documentation

## 2013-03-30 ENCOUNTER — Other Ambulatory Visit (HOSPITAL_COMMUNITY)
Admission: RE | Admit: 2013-03-30 | Discharge: 2013-03-30 | Disposition: A | Discharge status: Home / Self Care | Payer: BC Managed Care – PPO | Source: Ambulatory Visit | Attending: Obstetrics and Gynecology | Admitting: Obstetrics and Gynecology

## 2013-03-30 ENCOUNTER — Other Ambulatory Visit: Payer: Self-pay | Admitting: Obstetrics and Gynecology

## 2013-03-30 DIAGNOSIS — Z01419 Encounter for gynecological examination (general) (routine) without abnormal findings: Secondary | ICD-10-CM | POA: Insufficient documentation

## 2013-03-30 DIAGNOSIS — Z1151 Encounter for screening for human papillomavirus (HPV): Secondary | ICD-10-CM | POA: Insufficient documentation

## 2014-04-03 ENCOUNTER — Other Ambulatory Visit (HOSPITAL_COMMUNITY)
Admission: RE | Admit: 2014-04-03 | Discharge: 2014-04-03 | Disposition: A | Discharge status: Home / Self Care | Payer: BC Managed Care – PPO | Source: Ambulatory Visit | Attending: Obstetrics and Gynecology | Admitting: Obstetrics and Gynecology

## 2014-04-03 ENCOUNTER — Other Ambulatory Visit: Payer: Self-pay | Admitting: Obstetrics and Gynecology

## 2014-04-03 DIAGNOSIS — Z1151 Encounter for screening for human papillomavirus (HPV): Secondary | ICD-10-CM | POA: Insufficient documentation

## 2014-04-03 DIAGNOSIS — Z124 Encounter for screening for malignant neoplasm of cervix: Secondary | ICD-10-CM | POA: Insufficient documentation

## 2014-04-04 LAB — CYTOLOGY - PAP

## 2015-09-06 ENCOUNTER — Other Ambulatory Visit: Payer: Self-pay | Admitting: Family Medicine

## 2015-09-06 DIAGNOSIS — R221 Localized swelling, mass and lump, neck: Secondary | ICD-10-CM

## 2015-09-12 ENCOUNTER — Ambulatory Visit
Admission: RE | Admit: 2015-09-12 | Discharge: 2015-09-12 | Disposition: A | Discharge status: Home / Self Care | Payer: BC Managed Care – PPO | Source: Ambulatory Visit | Attending: Family Medicine | Admitting: Family Medicine

## 2015-09-12 DIAGNOSIS — R221 Localized swelling, mass and lump, neck: Secondary | ICD-10-CM

## 2015-09-20 ENCOUNTER — Other Ambulatory Visit: Payer: Self-pay | Admitting: Family Medicine

## 2015-09-20 DIAGNOSIS — IMO0002 Reserved for concepts with insufficient information to code with codable children: Secondary | ICD-10-CM

## 2015-09-20 DIAGNOSIS — R229 Localized swelling, mass and lump, unspecified: Principal | ICD-10-CM

## 2016-05-17 IMAGING — US US SOFT TISSUE HEAD/NECK
1 series · 13 of 13 positions shown · non-contrast
Comparison: None.

CLINICAL DATA: Palpable nodule involving the base of the neck near
the right shoulder. History of melanoma involving the right lateral
shoulder.

EXAM:
ULTRASOUND OF HEAD/NECK SOFT TISSUES
TECHNIQUE: Ultrasound examination of the head and neck soft tissues was
performed in the area of clinical concern.

[Series 1: us soft tissue head/neck · 0.06mm/px · 13 of 13 slices shown]
[im 1/13]
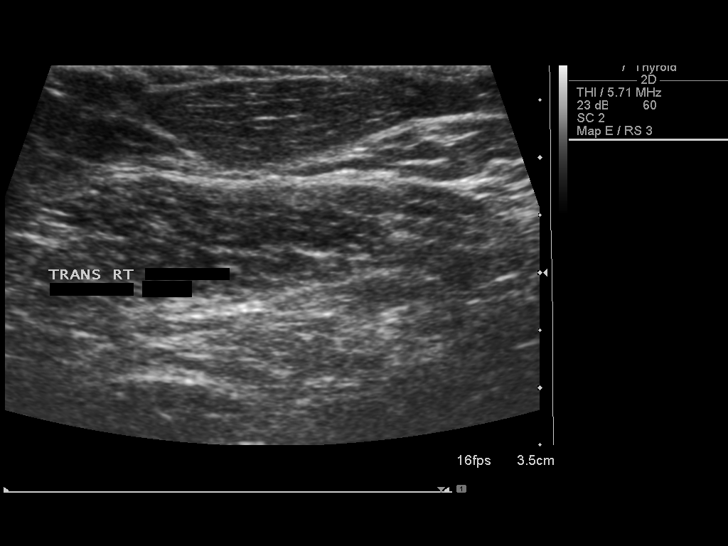
[im 2/13]
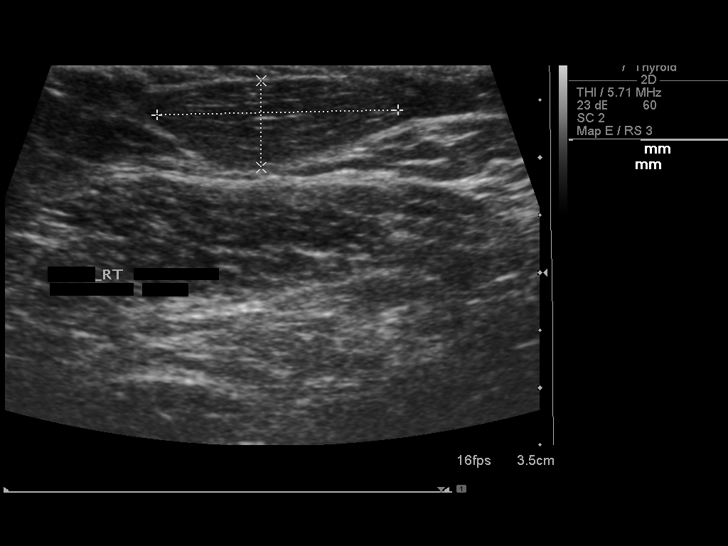
[im 3/13]
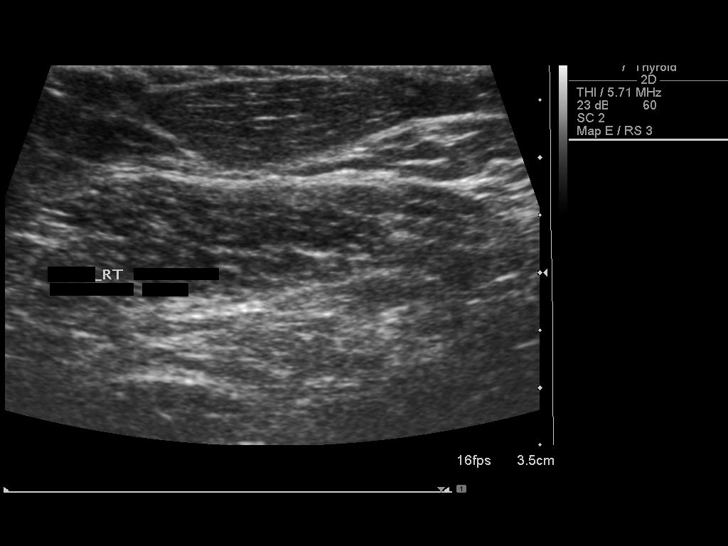
[im 4/13]
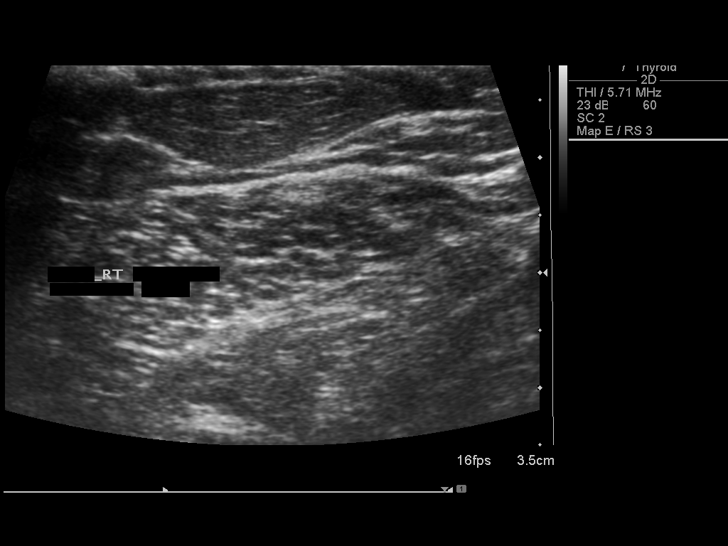
[im 5/13]
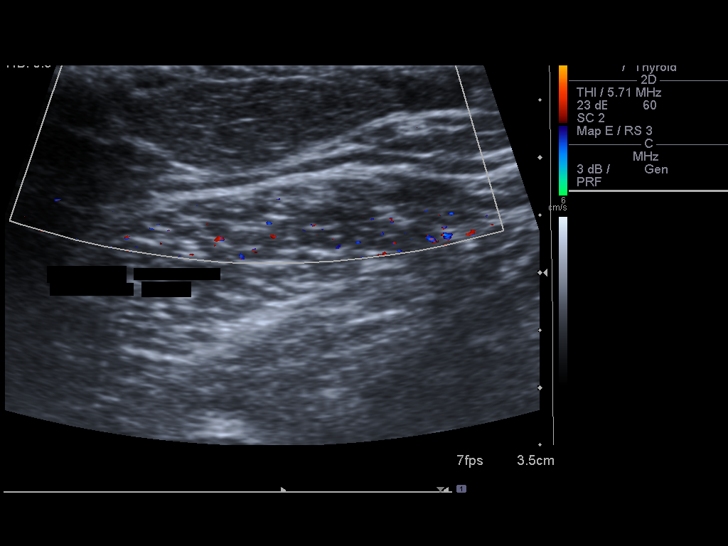
[im 6/13]
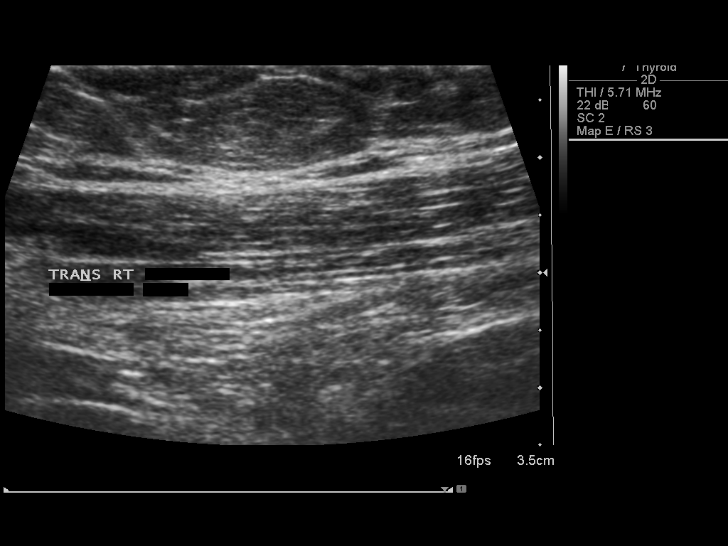
[im 7/13]
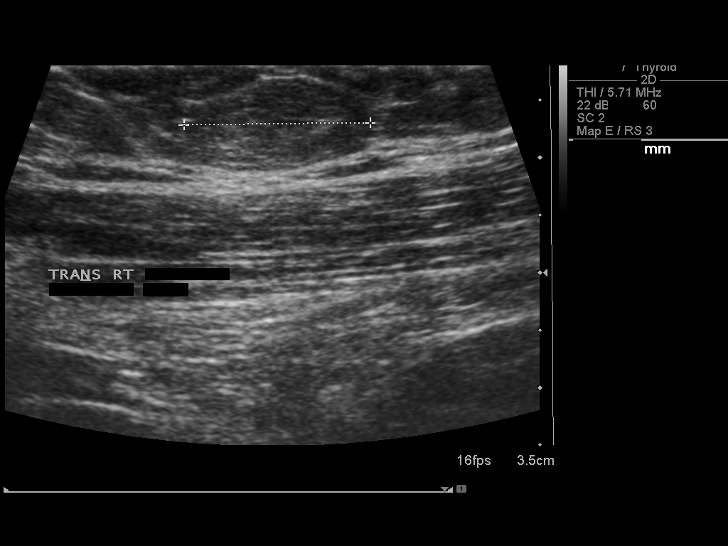
[im 8/13]
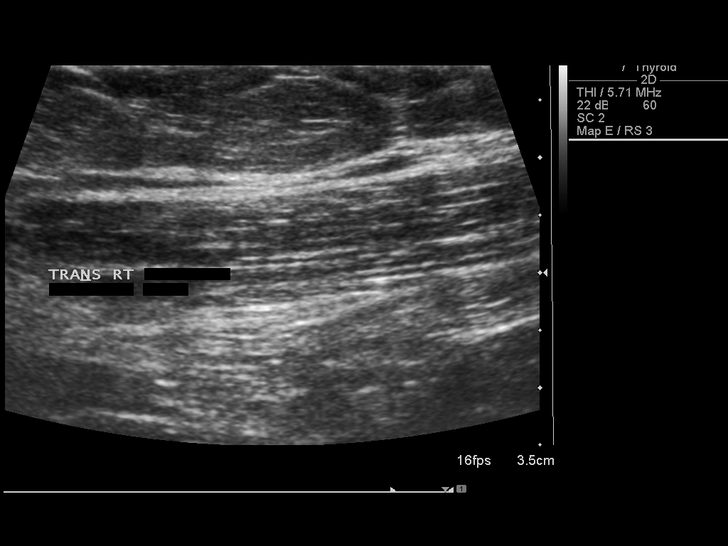
[im 9/13]
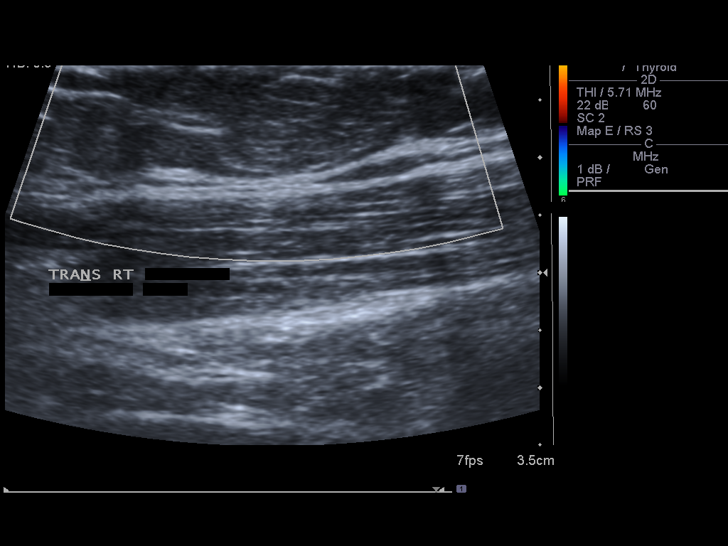
[im 10/13]
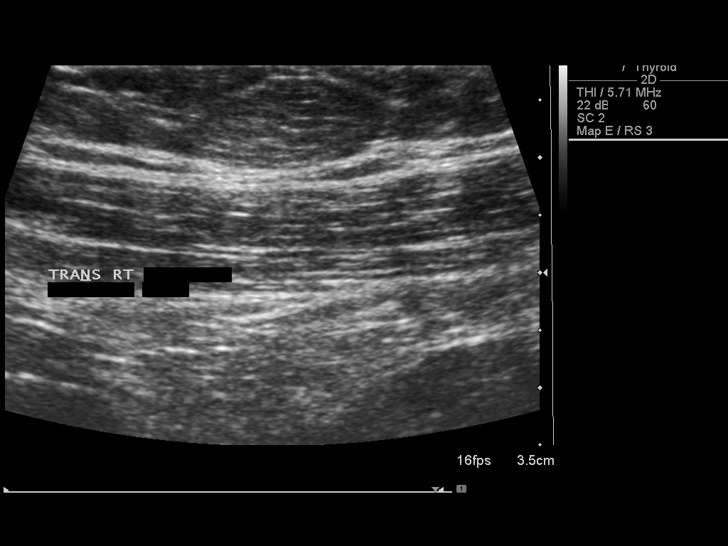
[im 11/13]
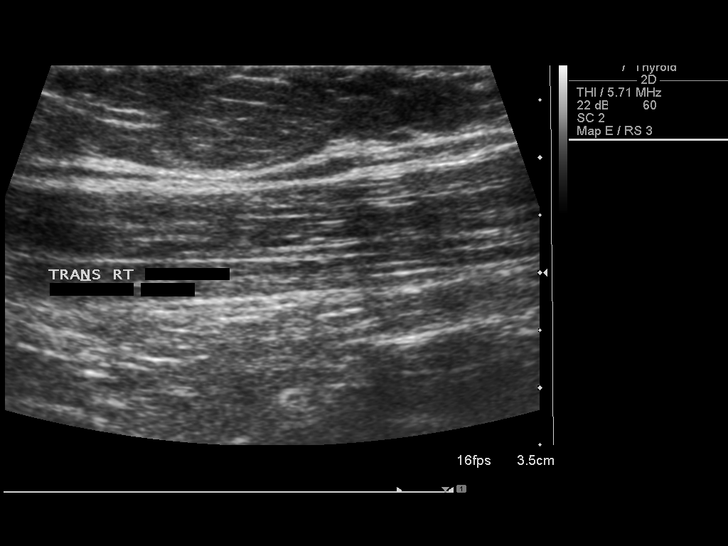
[im 12/13]
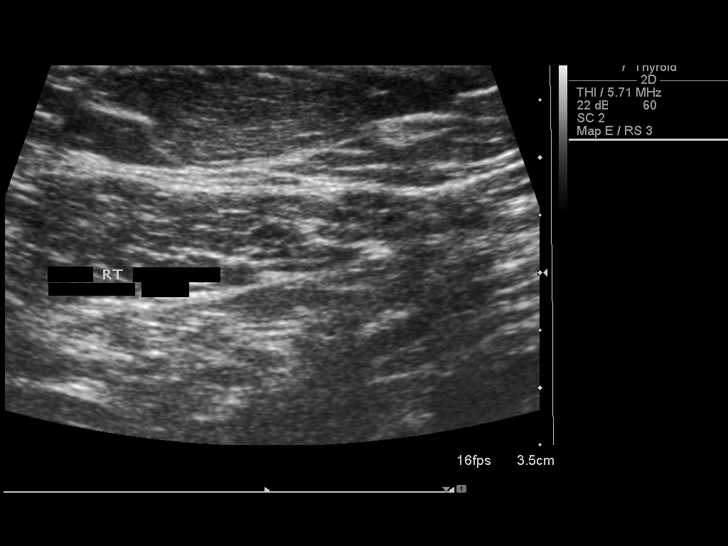
[im 13/13]
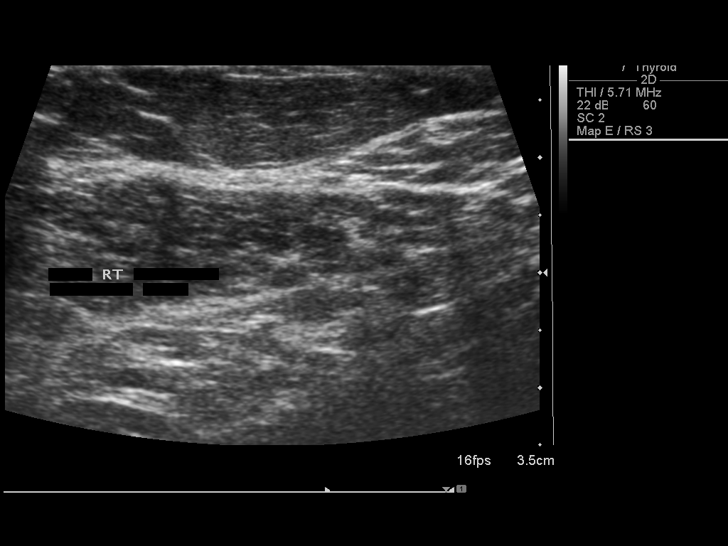

[13 of 13 positions shown; findings below may reference images not displayed]

FINDINGS: There is a slightly ill-defined approximately 2.1 x 0.8 x 1.6 cm
nodule within the subcutaneous tissues at the patient's palpable
area of concern about the right shoulder. This nodule demonstrates
similar echogenic appearance to the surrounding subcutaneous fat. No
definitive blood flow was demonstrated within this structure.

No other discrete solid or cystic lesions correlate with the
patient's palpable area of concern.
IMPRESSION: Ill-defined approximately 2.1 cm subcutaneous nodule correlates with
the patient's palpable area of concern about the right shoulder.
While indeterminate, this nodule demonstrates similar echogenic
appearance to the surrounding fat and thus is favored to represent a
lipoma. Given history of melanoma, clinical correlation to ensure
stability is recommended and if there is concern for enlargement, a
follow-up ultrasound in 6-12 months could be obtained to
ensure/document stability.

## 2018-06-03 ENCOUNTER — Other Ambulatory Visit: Payer: Self-pay | Admitting: Obstetrics and Gynecology

## 2018-06-03 ENCOUNTER — Other Ambulatory Visit (HOSPITAL_COMMUNITY)
Admission: RE | Admit: 2018-06-03 | Discharge: 2018-06-03 | Disposition: A | Discharge status: Home / Self Care | Payer: BC Managed Care – PPO | Source: Ambulatory Visit | Attending: Obstetrics and Gynecology | Admitting: Obstetrics and Gynecology

## 2018-06-03 DIAGNOSIS — Z01411 Encounter for gynecological examination (general) (routine) with abnormal findings: Secondary | ICD-10-CM | POA: Diagnosis not present

## 2018-06-07 LAB — CYTOLOGY - PAP
Diagnosis: NEGATIVE
HPV (WINDOPATH): NOT DETECTED

## 2019-11-26 ENCOUNTER — Ambulatory Visit: Payer: BC Managed Care – PPO | Attending: Internal Medicine

## 2019-11-26 DIAGNOSIS — Z23 Encounter for immunization: Secondary | ICD-10-CM | POA: Insufficient documentation

## 2019-11-26 NOTE — Progress Notes (Signed)
   Covid-19 Vaccination Clinic  Name:  Christine Norton    MRN: [id number] DOB: Dec 23, 1965  11/26/2019  [name] was observed post Covid-19 immunization for 15 minutes   without incidence. She was provided with Vaccine Information Sheet and instruction to access the V-Safe system.   [name] was instructed to call 911 with any severe reactions post vaccine: Marland Kitchen Difficulty breathing  . Swelling of your face and throat  . A fast heartbeat  . A bad rash all over your body  . Dizziness and weakness    Immunizations Administered    Name Date Dose VIS Date Route   Pfizer COVID-19 Vaccine 11/26/2019 12:42 PM 0.3 mL 09/09/2019 Intramuscular   Manufacturer: [employer], [employer]   Lot: [id number]   NDC: [address]-2

## 2019-12-27 ENCOUNTER — Ambulatory Visit: Payer: BC Managed Care – PPO

## 2020-01-02 ENCOUNTER — Ambulatory Visit: Payer: BC Managed Care – PPO | Attending: Internal Medicine

## 2020-01-02 DIAGNOSIS — Z23 Encounter for immunization: Secondary | ICD-10-CM

## 2020-01-02 NOTE — Progress Notes (Signed)
   Covid-19 Vaccination Clinic  Name:  ARMANDA FORAND    MRN: [id number] DOB: March 05, 1966  01/02/2020  Ms. Slabach was observed post Covid-19 immunization for 15 minutes without incident. She was provided with Vaccine Information Sheet and instruction to access the V-Safe system.   Ms. Belanger was instructed to call 911 with any severe reactions post vaccine: Marland Kitchen Difficulty breathing  . Swelling of face and throat  . A fast heartbeat  . A bad rash all over body  . Dizziness and weakness   Immunizations Administered    Name Date Dose VIS Date Route   Pfizer COVID-19 Vaccine 01/02/2020 10:02 AM 0.3 mL 09/09/2019 Intramuscular   Manufacturer: [employer], [employer]   Lot: [id number]   NDC: [address]-4

## 2022-11-06 ENCOUNTER — Other Ambulatory Visit: Payer: Self-pay | Admitting: Family Medicine

## 2022-11-06 ENCOUNTER — Encounter: Payer: Self-pay | Admitting: Family Medicine

## 2022-11-06 DIAGNOSIS — N950 Postmenopausal bleeding: Secondary | ICD-10-CM

## 2022-11-06 DIAGNOSIS — R2242 Localized swelling, mass and lump, left lower limb: Secondary | ICD-10-CM

## 2022-11-06 DIAGNOSIS — M25552 Pain in left hip: Secondary | ICD-10-CM

## 2022-12-22 ENCOUNTER — Ambulatory Visit
Admission: RE | Admit: 2022-12-22 | Discharge: 2022-12-22 | Disposition: A | Discharge status: Home / Self Care | Payer: BC Managed Care – PPO | Source: Ambulatory Visit | Attending: Family Medicine | Admitting: Family Medicine

## 2022-12-22 DIAGNOSIS — N950 Postmenopausal bleeding: Secondary | ICD-10-CM

## 2022-12-24 ENCOUNTER — Other Ambulatory Visit: Payer: Self-pay | Admitting: Family Medicine

## 2022-12-24 ENCOUNTER — Ambulatory Visit
Admission: RE | Admit: 2022-12-24 | Discharge: 2022-12-24 | Disposition: A | Discharge status: Home / Self Care | Payer: BC Managed Care – PPO | Source: Ambulatory Visit | Attending: Family Medicine | Admitting: Family Medicine

## 2022-12-24 DIAGNOSIS — M25552 Pain in left hip: Secondary | ICD-10-CM

## 2022-12-24 DIAGNOSIS — R2242 Localized swelling, mass and lump, left lower limb: Secondary | ICD-10-CM
